# Patient Record
Sex: Female | Born: 2000 | Race: White | Hispanic: No | Marital: Single | Smoking: Never smoker
Health system: Southern US, Community
[De-identification: ages and names within clinical notes are randomized; demographics above are authoritative.]

## PROBLEM LIST (undated history)

## (undated) ENCOUNTER — Inpatient Hospital Stay (HOSPITAL_COMMUNITY): Payer: Self-pay

## (undated) DIAGNOSIS — D649 Anemia, unspecified: Secondary | ICD-10-CM

## (undated) DIAGNOSIS — E079 Disorder of thyroid, unspecified: Secondary | ICD-10-CM

## (undated) DIAGNOSIS — A749 Chlamydial infection, unspecified: Secondary | ICD-10-CM

## (undated) HISTORY — PX: NO PAST SURGERIES: SHX2092

---
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
## 2019-01-05 ENCOUNTER — Other Ambulatory Visit: Payer: Self-pay

## 2019-01-05 ENCOUNTER — Inpatient Hospital Stay (HOSPITAL_COMMUNITY): Payer: Medicaid Other | Admitting: Anesthesiology

## 2019-01-05 ENCOUNTER — Encounter (HOSPITAL_COMMUNITY): Payer: Self-pay | Admitting: *Deleted

## 2019-01-05 ENCOUNTER — Inpatient Hospital Stay (HOSPITAL_COMMUNITY)
Admission: AD | Admit: 2019-01-05 | Discharge: 2019-01-07 | DRG: 807 | Disposition: A | Discharge status: Home / Self Care | Payer: Medicaid Other | Attending: Obstetrics and Gynecology | Admitting: Obstetrics and Gynecology

## 2019-01-05 DIAGNOSIS — Z3A39 39 weeks gestation of pregnancy: Secondary | ICD-10-CM

## 2019-01-05 DIAGNOSIS — O98212 Gonorrhea complicating pregnancy, second trimester: Secondary | ICD-10-CM | POA: Diagnosis present

## 2019-01-05 DIAGNOSIS — N9089 Other specified noninflammatory disorders of vulva and perineum: Secondary | ICD-10-CM

## 2019-01-05 DIAGNOSIS — O9902 Anemia complicating childbirth: Secondary | ICD-10-CM | POA: Diagnosis present

## 2019-01-05 DIAGNOSIS — D509 Iron deficiency anemia, unspecified: Secondary | ICD-10-CM | POA: Diagnosis present

## 2019-01-05 DIAGNOSIS — O99324 Drug use complicating childbirth: Secondary | ICD-10-CM | POA: Diagnosis present

## 2019-01-05 DIAGNOSIS — F1290 Cannabis use, unspecified, uncomplicated: Secondary | ICD-10-CM | POA: Diagnosis present

## 2019-01-05 DIAGNOSIS — O98812 Other maternal infectious and parasitic diseases complicating pregnancy, second trimester: Secondary | ICD-10-CM

## 2019-01-05 DIAGNOSIS — A749 Chlamydial infection, unspecified: Secondary | ICD-10-CM | POA: Diagnosis present

## 2019-01-05 DIAGNOSIS — O26893 Other specified pregnancy related conditions, third trimester: Secondary | ICD-10-CM | POA: Diagnosis present

## 2019-01-05 LAB — CBC
HCT: 34.3 % — ABNORMAL LOW (ref 36.0–46.0)
Hemoglobin: 10.9 g/dL — ABNORMAL LOW (ref 12.0–15.0)
MCH: 24 pg — ABNORMAL LOW (ref 26.0–34.0)
MCHC: 31.8 g/dL (ref 30.0–36.0)
MCV: 75.6 fL — ABNORMAL LOW (ref 80.0–100.0)
Platelets: 196 10*3/uL (ref 150–400)
RBC: 4.54 MIL/uL (ref 3.87–5.11)
RDW: 13.3 % (ref 11.5–15.5)
WBC: 14.6 10*3/uL — ABNORMAL HIGH (ref 4.0–10.5)
nRBC: 0 % (ref 0.0–0.2)

## 2019-01-05 LAB — TYPE AND SCREEN
ABO/RH(D): B NEG
Antibody Screen: NEGATIVE

## 2019-01-05 LAB — ABO/RH: ABO/RH(D): B NEG

## 2019-01-05 MED ORDER — ONDANSETRON HCL 4 MG PO TABS: 4.0000 mg | ORAL_TABLET | ORAL | Status: DC | PRN

## 2019-01-05 MED ORDER — EPHEDRINE 5 MG/ML INJ: 10.0000 mg | INTRAVENOUS | Status: DC | PRN

## 2019-01-05 MED ORDER — ACETAMINOPHEN 325 MG PO TABS: 650.0000 mg | ORAL_TABLET | ORAL | Status: DC | PRN

## 2019-01-05 MED ORDER — DIPHENHYDRAMINE HCL 50 MG/ML IJ SOLN: 12.5000 mg | INTRAMUSCULAR | Status: DC | PRN

## 2019-01-05 MED ORDER — PHENYLEPHRINE 40 MCG/ML (10ML) SYRINGE FOR IV PUSH (FOR BLOOD PRESSURE SUPPORT)
80.0000 ug | PREFILLED_SYRINGE | INTRAVENOUS | Status: DC | PRN
  Filled 2019-01-05: qty 10

## 2019-01-05 MED ORDER — OXYTOCIN BOLUS FROM INFUSION
500.0000 mL | Freq: Once | INTRAVENOUS | Status: AC
  Administered 2019-01-05: 500 mL via INTRAVENOUS

## 2019-01-05 MED ORDER — LACTATED RINGERS IV SOLN
500.0000 mL | Freq: Once | INTRAVENOUS | Status: AC
  Administered 2019-01-05: 500 mL via INTRAVENOUS

## 2019-01-05 MED ORDER — LACTATED RINGERS IV SOLN: 500.0000 mL | INTRAVENOUS | Status: DC | PRN

## 2019-01-05 MED ORDER — COCONUT OIL OIL: 1.0000 "application " | TOPICAL_OIL | Status: DC | PRN

## 2019-01-05 MED ORDER — SOD CITRATE-CITRIC ACID 500-334 MG/5ML PO SOLN: 30.0000 mL | ORAL | Status: DC | PRN

## 2019-01-05 MED ORDER — WITCH HAZEL-GLYCERIN EX PADS: 1.0000 "application " | MEDICATED_PAD | CUTANEOUS | Status: DC | PRN

## 2019-01-05 MED ORDER — FENTANYL CITRATE (PF) 100 MCG/2ML IJ SOLN
50.0000 ug | INTRAMUSCULAR | Status: DC | PRN
  Administered 2019-01-05: 10:00:00 100 ug via INTRAVENOUS
  Filled 2019-01-05: qty 2

## 2019-01-05 MED ORDER — FENTANYL-BUPIVACAINE-NACL 0.5-0.125-0.9 MG/250ML-% EP SOLN
12.0000 mL/h | EPIDURAL | Status: DC | PRN
  Filled 2019-01-05: qty 250

## 2019-01-05 MED ORDER — SIMETHICONE 80 MG PO CHEW: 80.0000 mg | CHEWABLE_TABLET | ORAL | Status: DC | PRN

## 2019-01-05 MED ORDER — OXYCODONE-ACETAMINOPHEN 5-325 MG PO TABS: 1.0000 | ORAL_TABLET | ORAL | Status: DC | PRN

## 2019-01-05 MED ORDER — LIDOCAINE HCL (PF) 1 % IJ SOLN
INTRAMUSCULAR | Status: DC | PRN
  Administered 2019-01-05 (×2): 5 mL via EPIDURAL

## 2019-01-05 MED ORDER — BENZOCAINE-MENTHOL 20-0.5 % EX AERO
1.0000 "application " | INHALATION_SPRAY | CUTANEOUS | Status: DC | PRN
  Administered 2019-01-05: 1 via TOPICAL
  Filled 2019-01-05: qty 56

## 2019-01-05 MED ORDER — OXYTOCIN 40 UNITS IN NORMAL SALINE INFUSION - SIMPLE MED
2.5000 [IU]/h | INTRAVENOUS | Status: DC
  Filled 2019-01-05: qty 1000

## 2019-01-05 MED ORDER — TETANUS-DIPHTH-ACELL PERTUSSIS 5-2.5-18.5 LF-MCG/0.5 IM SUSP: 0.5000 mL | Freq: Once | INTRAMUSCULAR | Status: DC

## 2019-01-05 MED ORDER — PHENYLEPHRINE 40 MCG/ML (10ML) SYRINGE FOR IV PUSH (FOR BLOOD PRESSURE SUPPORT): 80.0000 ug | PREFILLED_SYRINGE | INTRAVENOUS | Status: DC | PRN

## 2019-01-05 MED ORDER — DIBUCAINE 1 % RE OINT: 1.0000 "application " | TOPICAL_OINTMENT | RECTAL | Status: DC | PRN

## 2019-01-05 MED ORDER — FLEET ENEMA 7-19 GM/118ML RE ENEM: 1.0000 | ENEMA | RECTAL | Status: DC | PRN

## 2019-01-05 MED ORDER — LACTATED RINGERS IV SOLN
INTRAVENOUS | Status: DC
  Administered 2019-01-05 (×3): via INTRAVENOUS

## 2019-01-05 MED ORDER — SODIUM CHLORIDE (PF) 0.9 % IJ SOLN
INTRAMUSCULAR | Status: DC | PRN
  Administered 2019-01-05: 12 mL/h via EPIDURAL

## 2019-01-05 MED ORDER — ZOLPIDEM TARTRATE 5 MG PO TABS: 5.0000 mg | ORAL_TABLET | Freq: Every evening | ORAL | Status: DC | PRN

## 2019-01-05 MED ORDER — OXYCODONE-ACETAMINOPHEN 5-325 MG PO TABS: 2.0000 | ORAL_TABLET | ORAL | Status: DC | PRN

## 2019-01-05 MED ORDER — ONDANSETRON HCL 4 MG/2ML IJ SOLN: 4.0000 mg | Freq: Four times a day (QID) | INTRAMUSCULAR | Status: DC | PRN

## 2019-01-05 MED ORDER — IBUPROFEN 600 MG PO TABS
600.0000 mg | ORAL_TABLET | Freq: Four times a day (QID) | ORAL | Status: DC
  Administered 2019-01-05 – 2019-01-07 (×8): 600 mg via ORAL
  Filled 2019-01-05 (×8): qty 1

## 2019-01-05 MED ORDER — PRENATAL MULTIVITAMIN CH
1.0000 | ORAL_TABLET | Freq: Every day | ORAL | Status: DC
  Administered 2019-01-06 – 2019-01-07 (×2): 1 via ORAL
  Filled 2019-01-05 (×2): qty 1

## 2019-01-05 MED ORDER — LIDOCAINE HCL (PF) 1 % IJ SOLN
30.0000 mL | INTRAMUSCULAR | Status: DC | PRN
  Filled 2019-01-05: qty 30

## 2019-01-05 MED ORDER — SENNOSIDES-DOCUSATE SODIUM 8.6-50 MG PO TABS
2.0000 | ORAL_TABLET | ORAL | Status: DC
  Administered 2019-01-05 – 2019-01-06 (×2): 2 via ORAL
  Filled 2019-01-05 (×2): qty 2

## 2019-01-05 MED ORDER — DIPHENHYDRAMINE HCL 25 MG PO CAPS: 25.0000 mg | ORAL_CAPSULE | Freq: Four times a day (QID) | ORAL | Status: DC | PRN

## 2019-01-05 MED ORDER — ONDANSETRON HCL 4 MG/2ML IJ SOLN: 4.0000 mg | INTRAMUSCULAR | Status: DC | PRN

## 2019-01-05 NOTE — Anesthesia Procedure Notes (Signed)
Epidural Patient location during procedure: OB Start time: 01/05/2019 11:05 AM End time: 01/05/2019 11:21 AM  Staffing Anesthesiologist: Heather Roberts, MD Performed: anesthesiologist   Preanesthetic Checklist Completed: patient identified, site marked, pre-op evaluation, timeout performed, IV checked, risks and benefits discussed and monitors and equipment checked  Epidural Patient position: sitting Prep: DuraPrep Patient monitoring: heart rate, cardiac monitor, continuous pulse ox and blood pressure Approach: midline Location: L2-L3 Injection technique: LOR saline  Needle:  Needle type: Tuohy  Needle gauge: 17 G Needle length: 9 cm Needle insertion depth: 6 cm Catheter size: 20 Guage Catheter at skin depth: 11 cm Test dose: negative and Other  Assessment Events: blood not aspirated, injection not painful, no injection resistance and negative IV test  Additional Notes Informed consent obtained prior to proceeding including risk of failure, 1% risk of PDPH, risk of minor discomfort and bruising.  Discussed rare but serious complications including epidural abscess, permanent nerve injury, epidural hematoma.  Discussed alternatives to epidural analgesia and patient desires to proceed.  Timeout performed pre-procedure verifying patient name, procedure, and platelet count.  Patient tolerated procedure well.

## 2019-01-05 NOTE — Anesthesia Preprocedure Evaluation (Signed)
Anesthesia Evaluation  Patient identified by MRN, date of birth, ID band Patient awake    Reviewed: Allergy & Precautions, NPO status , Patient's Chart, lab work & pertinent test results  Airway Mallampati: II  TM Distance: >3 FB     Dental no notable dental hx. (+) Dental Advisory Given   Pulmonary neg pulmonary ROS,    Pulmonary exam normal        Cardiovascular negative cardio ROS Normal cardiovascular exam     Neuro/Psych negative neurological ROS  negative psych ROS   GI/Hepatic negative GI ROS, Neg liver ROS,   Endo/Other  negative endocrine ROS  Renal/GU negative Renal ROS  negative genitourinary   Musculoskeletal negative musculoskeletal ROS (+)   Abdominal   Peds negative pediatric ROS (+)  Hematology negative hematology ROS (+)   Anesthesia Other Findings   Reproductive/Obstetrics (+) Pregnancy                             Anesthesia Physical Anesthesia Plan  ASA: II  Anesthesia Plan: Epidural   Post-op Pain Management:    Induction:   PONV Risk Score and Plan:   Airway Management Planned: Natural Airway  Additional Equipment:   Intra-op Plan:   Post-operative Plan:   Informed Consent: I have reviewed the patients History and Physical, chart, labs and discussed the procedure including the risks, benefits and alternatives for the proposed anesthesia with the patient or authorized representative who has indicated his/her understanding and acceptance.     Dental advisory given  Plan Discussed with: Anesthesiologist  Anesthesia Plan Comments:         Anesthesia Quick Evaluation

## 2019-01-05 NOTE — H&P (Signed)
LABOR AND DELIVERY ADMISSION HISTORY AND PHYSICAL NOTE  Erin Powers is a 18 y.o. female G1P1001 with IUP at [redacted]w[redacted]d by LMP presenting for SOL @ 0500.  She reports positive fetal movement. She denies leakage of fluid or vaginal bleeding.  Prenatal History/Complications: PNC at [facility] Pregnancy complications:  - none  Past Medical History: Past Medical History:  Diagnosis Date  . Anemia   . Chlamydia     Past Surgical History: Past Surgical History:  Procedure Laterality Date  . NO PAST SURGERIES      Obstetrical History: OB History    Gravida  1   Para  1   Term  1   Preterm      AB      Living  1     SAB      TAB      Ectopic      Multiple  0   Live Births  1           Social History: Social History   Socioeconomic History  . Marital status: Single    Spouse name: Not on file  . Number of children: Not on file  . Years of education: Not on file  . Highest education level: Not on file  Occupational History  . Not on file  Social Needs  . Financial resource strain: Not on file  . Food insecurity:    Worry: Not on file    Inability: Not on file  . Transportation needs:    Medical: Not on file    Non-medical: Not on file  Tobacco Use  . Smoking status: Never Smoker  . Smokeless tobacco: Never Used  Substance and Sexual Activity  . Alcohol use: No  . Drug use: Yes    Types: Marijuana    Comment: last smoked Feb/March 2020  . Sexual activity: Yes    Birth control/protection: None  Lifestyle  . Physical activity:    Days per week: Not on file    Minutes per session: Not on file  . Stress: Not on file  Relationships  . Social connections:    Talks on phone: Not on file    Gets together: Not on file    Attends religious service: Not on file    Active member of club or organization: Not on file    Attends meetings of clubs or organizations: Not on file    Relationship status: Not on file  Other Topics Concern  . Not on file   Social History Narrative  . Not on file    Family History: Family History  Problem Relation Age of Onset  . Diabetes Mother     Allergies: Allergies  Allergen Reactions  . Latex Hives    Medications Prior to Admission  Medication Sig Dispense Refill Last Dose  . clotrimazole (GYNE-LOTRIMIN) 1 % vaginal cream INSERT ONE APPLICATORFUL VAGINALLY EACH NIGHT AT BEDTIME.   Past Week at Unknown time  . Prenatal Vit-Fe Fumarate-FA (PRENATAL MULTIVITAMIN) TABS tablet Take 1 tablet by mouth daily at 12 noon.   Past Week at Unknown time  . valACYclovir (VALTREX) 1000 MG tablet Take 1 tablet (1,000 mg total) by mouth 2 (two) times daily. 20 tablet 0      Review of Systems  All systems reviewed and negative except as stated in HPI  Physical Exam Blood pressure 117/69, pulse 79, temperature 98.1 F (36.7 C), temperature source Oral, resp. rate 18, height 5\' 1"  (1.549 m), weight 69 kg,  last menstrual period 03/03/2018, SpO2 98 %, currently breastfeeding. General appearance: alert, cooperative and no distress Lungs: clear to auscultation bilaterally Heart: regular rate and rhythm Abdomen: soft, non-tender; bowel sounds normal Extremities: No calf swelling or tenderness Presentation: cephalic CEFM - FHR: 125 bpm / moderate variability / accels present / decels absent / TOCO: regular every 2-4 mins Dilation: 10 Effacement (%): 100 Station: Plus 3 Exam by:: J.Cox, RN  Prenatal labs: ABO, Rh: --/--/B NEG, B NEG Performed at [facility], [address], [place], [place] [address]  [phone]) Antibody: NEG (04/06 0165) Rubella: Immune (08/22 0000) RPR: Nonreactive (08/22 0000)  HBsAg: Negative (08/22 0000)  HIV: Non-reactive (08/22 0000)  GC/Chlamydia: Neg/Neg -- third trimester (Pos/Pos in early pregnancy) GBS: Negative (03/16 0000)  1 hr Glucola: Normal  104 Genetic screening:  Normal Anatomy US: Normal  Prenatal Transfer Tool  Maternal Diabetes: No Genetic  Screening: Normal Maternal Ultrasounds/Referrals: Normal Fetal Ultrasounds or other Referrals:  None Maternal Substance Abuse:  No Significant Maternal Medications:  None Significant Maternal Lab Results: Lab values include: Group B Strep negative  Results for orders placed or performed during the hospital encounter of 01/05/19 (from the past 24 hour(s))  Type and screen [facility]   Collection Time: 01/05/19  9:03 AM  Result Value Ref Range   ABO/RH(D) B NEG    Antibody Screen NEG    Sample Expiration      01/08/2019 Performed at [facility], [address] [place], [place] [address]   ABO/Rh   Collection Time: 01/05/19  9:03 AM  Result Value Ref Range   ABO/RH(D)      B NEG Performed at [facility], [address], [place], [place] [address]   CBC   Collection Time: 01/05/19  9:13 AM  Result Value Ref Range   WBC 14.6 (H) 4.0 - 10.5 K/uL   RBC 4.54 3.87 - 5.11 MIL/uL   Hemoglobin 10.9 (L) 12.0 - 15.0 g/dL   HCT 67.5 (L) 44.9 - 20.1 %   MCV 75.6 (L) 80.0 - 100.0 fL   MCH 24.0 (L) 26.0 - 34.0 pg   MCHC 31.8 30.0 - 36.0 g/dL   RDW 00.7 12.1 - 97.5 %   Platelets 196 150 - 400 K/uL   nRBC 0.0 0.0 - 0.2 %    Patient Active Problem List   Diagnosis Date Noted  . Normal labor 01/05/2019  . Gonorrhea affecting pregnancy in second trimester 10/08/2018  . Chlamydia infection affecting pregnancy in second trimester 10/08/2018  . Labial lesion 10/08/2018  . Anemia, iron deficiency 06/19/2013  . Migraine 10/27/2012    Assessment: Erin Powers is a 18 y.o. G1P1001 at [redacted]w[redacted]d here for SOL.  #Labor: Active #Pain: Epidural #FWB: Category 1 #ID:  n/a #MOF: Breast #MOC:unsure #Circ:  Kimber Relic, CNM 01/05/2019, 8:17 PM

## 2019-01-05 NOTE — Discharge Summary (Signed)
Postpartum Discharge Summary     Patient Name: Erin Powers DOB: 17-Sep-2001 MRN: [id number]  Date of admission: 01/05/2019 Delivering Provider: [name]   Date of discharge: 01/07/2019  Admitting diagnosis: 40WKS CTX Intrauterine pregnancy: [redacted]w[redacted]d     Secondary diagnosis:  Principal Problem:   Normal labor Active Problems:   Anemia, iron deficiency   Gonorrhea affecting pregnancy in second trimester   Chlamydia infection affecting pregnancy in second trimester  Additional problems: none     Discharge diagnosis: Term Pregnancy Delivered                                                                                                Post partum procedures:none  Augmentation: none  Complications: None  Hospital course:  Onset of Labor With Vaginal Delivery     18 y.o. yo G1P1001 at [redacted]w[redacted]d was admitted in Active Labor on 01/05/2019. Patient had an uncomplicated labor course as follows:  Membrane Rupture Time/Date: 10:40 AM ,01/05/2019   Intrapartum Procedures: Episiotomy: None [1]                                         Lacerations:  1st degree [2];Perineal [11]  Patient had a delivery of a Viable infant. 01/05/2019  Information for the patient's newborn:  Onisha, [place] [[id number]]  Delivery Method: Vag-Spont    Pateint had an uncomplicated postpartum course.  She is ambulating, tolerating a regular diet, passing flatus, and urinating well. Patient is discharged home in stable condition on 01/07/19.   Magnesium Sulfate recieved: No BMZ received: No  Physical exam  Vitals:   01/06/19 0522 01/06/19 1324 01/06/19 2148 01/07/19 0500  BP: 109/60 105/69 108/75 109/79  Pulse: 75 64 83 69  Resp: 16 16 16 18   Temp: 97.8 F (36.6 C) 98.1 F (36.7 C) 98.2 F (36.8 C) 98.2 F (36.8 C)  TempSrc: Oral Oral Oral Oral  SpO2:  100% 99% 97%  Weight:      Height:       General: alert, cooperative and no distress Lochia: appropriate Uterine Fundus: firm Incision:  N/A DVT Evaluation: No evidence of DVT seen on physical exam. Labs: Lab Results  Component Value Date   WBC 14.6 (H) 01/05/2019   HGB 10.9 (L) 01/05/2019   HCT 34.3 (L) 01/05/2019   MCV 75.6 (L) 01/05/2019   PLT 196 01/05/2019   CMP Latest Ref Rng & Units 04/24/2013  Glucose 70 - 99 mg/dL 82  BUN 6 - 23 mg/dL 8  Creatinine 8.29 - 9.37 mg/dL 1.69  Sodium 678 - 938 mEq/L 141  Potassium 3.5 - 5.3 mEq/L 4.4  Chloride 96 - 112 mEq/L 108  CO2 19 - 32 mEq/L 23  Calcium 8.4 - 10.5 mg/dL 9.5  Total Protein 6.0 - 8.3 g/dL 6.6  Total Bilirubin 0.3 - 1.2 mg/dL 0.3  Alkaline Phos 50 - 162 U/L 121  AST 0 - 37 U/L 15  ALT 0 - 35 U/L 9    Discharge instruction:  per After Visit Summary and "Baby and Me Booklet".  After visit meds:  Allergies as of 01/07/2019      Reactions   Latex Hives      Medication List    STOP taking these medications   clotrimazole 1 % vaginal cream Commonly known as:  GYNE-LOTRIMIN   valACYclovir 1000 MG tablet Commonly known as:  VALTREX     TAKE these medications   ibuprofen 600 MG tablet Commonly known as:  ADVIL,MOTRIN Take 1 tablet (600 mg total) by mouth every 6 (six) hours.   prenatal multivitamin Tabs tablet Take 1 tablet by mouth daily at 12 noon.       Diet: No evidence of DVT seen on physical exam.  Activity: Advance as tolerated. Pelvic rest for 6 weeks.   Outpatient follow up:4 weeks Follow up Appt: Future Appointments  Date Time Provider Department Center  01/12/2019 11:30 AM [facility] [place] 1 WH-[facility]   Follow up Visit: Follow-up Information    Department, [facility] Follow up.   Contact information: [address] [place] [address] [phone]            Please schedule this patient for Postpartum visit in: 6 weeks with the following provider: Any provider For C/S patients schedule nurse incision check in weeks 2 weeks: no Low risk pregnancy complicated by: none Delivery mode:  SVD Anticipated  Birth Control:  other/unsure PP Procedures needed: none  Schedule Integrated BH visit: no   Newborn Data: Live born female "Serenity" Birth Weight: 7 lb 9.2 oz (3435 g) APGAR: 8, 9  Newborn Delivery   Birth date/time:  01/05/2019 13:51:00 Delivery type:  Vaginal, Spontaneous     Baby Feeding: Breast Disposition:home with mother   01/07/2019 Scheryl Darter, MD

## 2019-01-05 NOTE — MAU Note (Signed)
Reports with ctxs every 4 minutes that began @ 0530 this morning.  Denies LOF or VB.  Repots +FM.

## 2019-01-05 NOTE — Anesthesia Postprocedure Evaluation (Signed)
Anesthesia Post Note  Patient: Erin Powers  Procedure(s) Performed: AN AD HOC LABOR EPIDURAL     Patient location during evaluation: Mother Baby Anesthesia Type: Epidural Level of consciousness: awake and oriented Pain management: pain level controlled Vital Signs Assessment: post-procedure vital signs reviewed and stable Respiratory status: spontaneous breathing and respiratory function stable Cardiovascular status: blood pressure returned to baseline and stable Postop Assessment: no headache Anesthetic complications: no    Last Vitals:  Vitals:   01/05/19 1546 01/05/19 1649  BP: 114/70 117/69  Pulse: 75 79  Resp: 18 18  Temp: 36.7 C 36.7 C  SpO2: 99% 98%    Last Pain:  Vitals:   01/05/19 1649  TempSrc: Oral  PainSc:    Pain Goal:                   Erin Powers

## 2019-01-06 LAB — RPR: RPR Ser Ql: NONREACTIVE

## 2019-01-06 NOTE — Clinical Social Work Maternal (Signed)
CLINICAL SOCIAL WORK MATERNAL/CHILD NOTE  Patient Details  Name: Erin Powers MRN: [id number] Date of Birth: 07-04-2001  Date:  01/06/2019  Clinical Social Worker Initiating Note:  Hortencia Pilar, LCSWA  Date/Time: Initiated:  01/06/19/0930     Child's Name:  Erin Powers    Biological Parents:  Mother(Erin Powers)   Need for Interpreter:  None   Reason for Referral:  Current Substance Use/Substance Use During Pregnancy    Address:  [address] [place] [address]    Phone number:  [phone] (home)     Additional phone number:  Household Members/Support Persons (HM/SP):   Household Member/Support Person 3   HM/SP Name Relationship DOB or Age  HM/SP -1   Erin Powers   [name] dad    HM/SP -2   Erin Powers   [name] mom    HM/SP -3 Erin Powers  [name]   HM/SP -4        HM/SP -5        HM/SP -6        HM/SP -7        HM/SP -8          Natural Supports (not living in the home):      Professional Supports: None   Employment: Unemployed   Type of Work:     Education:  Engineer, agricultural   Homebound arranged:    Surveyor, quantity Resources:  [employer]   Other Resources:  [employer], Sales executive ([name] reports that she needs to reapply. )   Cultural/Religious Considerations Which May Impact Care:  none reported   Strengths:  Compliance with medical plan , Ability to meet basic needs    Psychotropic Medications:         Pediatrician:       Pediatrician List:   [facility] [place]    [place]      Pediatrician Fax Number:    Risk Factors/Current Problems:  Family/Relationship Issues ([name] not sure who FOB is at this time. )   Cognitive State:  Alert , Able to Concentrate , Insightful    Mood/Affect:  Calm , Interested    CSW Assessment: CSW consulted as [name] has documented THC use during pregnancy. CSW went to speak with [name] at bedside.   Upon entering the  room, CSW noted that [name] was lying in bed holding infant. CSW also observed that a gentleman was lying on the couch asleep. CSW advised [name] that by hospital policy we have to ask that all parties in the room step out to speak with [name] alone. [name] requested that gentleman stay in the room.    CSW began assessment by introducing CSW's role as well as informing [name] the reason for CSW coming to visit. [name] was very receptive to speaking with CSW and began to talk. [name] expressed that she did use THC during her pregnancy (last use being in February) as a way to sleep as well as to for nauseous. [name] reports that she didn't do this often only when needed. CSW advised [name] that since [name] has used THC during pregnancy, a CPS report would need to be made if infant came back positive for any substance CSW advised [name] of the process for Cord Screen and the need for CPS report to be made if infant comes back positive for any substance that wasn't prescribed to her. [name] understanding and  denies using any other substances.    [name] reports that she is currently unsure of who the infants father is therefore chose to not give a name for FOB. [name] identified her supports as the people who she lives with which are her mother and father Erin Powers(Erin Powers and Erin DikeJennifer). [name] report that they are who she can count on to provide for her if no one else will or can. [name] disclosed that she graduated from [employer] in 2018. [name] currently unemployed per report. [name] reports that she was getting Food Stamps and WIC but needs to get this restarted for her and infant. CSW strongly encouraged [name] to call worker with DSS to see about getting this as well as Medicaid started for her and infant. [name] reports that she will.    CSW discussed with [name] safe sleeping habits as well as signs and symptoms for PPD. [name] denies ever having any mental health diagnoses but suggested that if they started she would seek help. CSW provide further  education to [facility] on the importance of self care for [name] and infant.   CSW Plan/Description:  Hospital Drug Screen Policy Information, CSW Will Continue to Monitor Umbilical Cord Tissue Drug Screen Results and Make Report if Warranted, Sudden Infant Death Syndrome (SIDS) Education, No Further Intervention Required/No Barriers to Discharge    Robb MatarKierra S Cayle Thunder, LCSWA 01/06/2019, 10:31 AM

## 2019-01-06 NOTE — Lactation Note (Signed)
This note was copied from a baby's chart. Lactation Consultation Note  Patient Name: Erin Powers [id number] Date: 01/06/2019 Reason for consult: Initial assessment;Primapara;Term Baby is 21 hours old/2% weight loss.  Mom states baby fed well during the night but sleepy this morning.  She recently attempted but baby showing no interest.  Instructed to feed with any feeding cue and call for assist prn.  Breastfeeding consultation services and support information given and reviewed.  Maternal Data    Feeding    LATCH Score                   Interventions    Lactation Tools Discussed/Used     Consult Status Consult Status: Follow-up Date: 01/07/19 Follow-up type: In-patient    Huston Foley 01/06/2019, 11:16 AM

## 2019-01-06 NOTE — Progress Notes (Signed)
Post Partum Day 1  Subjective: no complaints, up ad lib, voiding, tolerating PO and + flatus  Objective: Blood pressure 109/60, pulse 75, temperature 97.8 F (36.6 C), temperature source Oral, resp. rate 16, height 5\' 1"  (1.549 m), weight 69 kg, last menstrual period 03/03/2018, SpO2 98 %, unknown if currently breastfeeding.  Physical Exam:  General: alert, cooperative and no distress Lochia: appropriate Uterine Fundus: firm Incision: n/a DVT Evaluation: No evidence of DVT seen on physical exam.  Recent Labs    01/05/19 0913  HGB 10.9*  HCT 34.3*    Assessment/Plan: Contraception Considering inpatient Nexplanon, Would like D/C today. Pediatrician to round on baby.   LOS: 1 day   Sharen Counter 01/06/2019, 10:28 AM

## 2019-01-06 NOTE — Lactation Note (Signed)
This note was copied from a baby's chart. Lactation Consultation Note  Patient Name: Erin Powers [id number] Date: 01/06/2019  P1, 15 hour female infant. LC entered room mom, dad and infant asleep.   Maternal Data    Feeding Feeding Type: Breast Fed  LATCH Score                   Interventions    Lactation Tools Discussed/Used     Consult Status      Danelle Earthly 01/06/2019, 5:03 AM

## 2019-01-07 MED ORDER — IBUPROFEN 600 MG PO TABS: 600.0000 mg | ORAL_TABLET | Freq: Four times a day (QID) | ORAL | 0 refills | Status: DC

## 2019-01-07 NOTE — Discharge Instructions (Signed)
Vaginal Delivery, Care After °Refer to this sheet in the next few weeks. These instructions provide you with information about caring for yourself after vaginal delivery. Your health care provider may also give you more specific instructions. Your treatment has been planned according to current medical practices, but problems sometimes occur. Call your health care provider if you have any problems or questions. °What can I expect after the procedure? °After vaginal delivery, it is common to have: °· Some bleeding from your vagina. °· Soreness in your abdomen, your vagina, and the area of skin between your vaginal opening and your anus (perineum). °· Pelvic cramps. °· Fatigue. °Follow these instructions at home: °Medicines °· Take over-the-counter and prescription medicines only as told by your health care provider. °· If you were prescribed an antibiotic medicine, take it as told by your health care provider. Do not stop taking the antibiotic until it is finished. °Driving ° °· Do not drive or operate heavy machinery while taking prescription pain medicine. °· Do not drive for 24 hours if you received a sedative. °Lifestyle °· Do not drink alcohol. This is especially important if you are breastfeeding or taking medicine to relieve pain. °· Do not use tobacco products, including cigarettes, chewing tobacco, or e-cigarettes. If you need help quitting, ask your health care provider. °Eating and drinking °· Drink at least 8 eight-ounce glasses of water every day unless you are told not to by your health care provider. If you choose to breastfeed your baby, you may need to drink more water than this. °· Eat high-fiber foods every day. These foods may help prevent or relieve constipation. High-fiber foods include: °? Whole grain cereals and breads. °? Brown rice. °? Beans. °? Fresh fruits and vegetables. °Activity °· Return to your normal activities as told by your health care provider. Ask your health care provider what  activities are safe for you. °· Rest as much as possible. Try to rest or take a nap when your baby is sleeping. °· Do not lift anything that is heavier than your baby or 10 lb (4.5 kg) until your health care provider says that it is safe. °· Talk with your health care provider about when you can engage in sexual activity. This may depend on your: °? Risk of infection. °? Rate of healing. °? Comfort and desire to engage in sexual activity. °Vaginal Care °· If you have an episiotomy or a vaginal tear, check the area every day for signs of infection. Check for: °? More redness, swelling, or pain. °? More fluid or blood. °? Warmth. °? Pus or a bad smell. °· Do not use tampons or douches until your health care provider says this is safe. °· Watch for any blood clots that may pass from your vagina. These may look like clumps of dark red, brown, or black discharge. °General instructions °· Keep your perineum clean and dry as told by your health care provider. °· Wear loose, comfortable clothing. °· Wipe from front to back when you use the toilet. °· Ask your health care provider if you can shower or take a bath. If you had an episiotomy or a perineal tear during labor and delivery, your health care provider may tell you not to take baths for a certain length of time. °· Wear a bra that supports your breasts and fits you well. °· If possible, have someone help you with household activities and help care for your baby for at least a few days after you   leave the hospital. °· Keep all follow-up visits for you and your baby as told by your health care provider. This is important. °Contact a health care provider if: °· You have: °? Vaginal discharge that has a bad smell. °? Difficulty urinating. °? Pain when urinating. °? A sudden increase or decrease in the frequency of your bowel movements. °? More redness, swelling, or pain around your episiotomy or vaginal tear. °? More fluid or blood coming from your episiotomy or vaginal  tear. °? Pus or a bad smell coming from your episiotomy or vaginal tear. °? A fever. °? A rash. °? Little or no interest in activities you used to enjoy. °? Questions about caring for yourself or your baby. °· Your episiotomy or vaginal tear feels warm to the touch. °· Your episiotomy or vaginal tear is separating or does not appear to be healing. °· Your breasts are painful, hard, or turn red. °· You feel unusually sad or worried. °· You feel nauseous or you vomit. °· You pass large blood clots from your vagina. If you pass a blood clot from your vagina, save it to show to your health care provider. Do not flush blood clots down the toilet without having your health care provider look at them. °· You urinate more than usual. °· You are dizzy or light-headed. °· You have not breastfed at all and you have not had a menstrual period for 12 weeks after delivery. °· You have stopped breastfeeding and you have not had a menstrual period for 12 weeks after you stopped breastfeeding. °Get help right away if: °· You have: °? Pain that does not go away or does not get better with medicine. °? Chest pain. °? Difficulty breathing. °? Blurred vision or spots in your vision. °? Thoughts about hurting yourself or your baby. °· You develop pain in your abdomen or in one of your legs. °· You develop a severe headache. °· You faint. °· You bleed from your vagina so much that you fill two sanitary pads in one hour. °This information is not intended to replace advice given to you by your health care provider. Make sure you discuss any questions you have with your health care provider. °Document Released: 09/14/2000 Document Revised: 02/29/2016 Document Reviewed: 10/02/2015 °Elsevier Interactive Patient Education © 2019 [employer] ° °

## 2019-01-07 NOTE — Lactation Note (Signed)
This note was copied from a baby's chart. Lactation Consultation Note  Patient Name: Erin Powers CYELY'H Date: 01/07/2019 Reason for consult: Follow-up assessment;Term;Primapara;1st time breastfeeding  P1 mother whose infant is now 16 hours old.    Baby sleeping in bassinet when I arrived.  Mother had no questions/concerns related to breast feeding.  Her feeding plan of choice is breast/bottle.    Mother continues to breast feed and supplement after feeding.  Reminded her to always breast feed prior to supplementation.  She stated her breasts feel heavier this morning.  Engorgement prevention/treatment discussed.  Manual pumps from the hospital are on back order so I was not able to provide a pump.  Mother is familiar with hand expression.  She plans to obtain a DEBP after discharge.  Mother stated her nipples are sore.  She feels like latching is going well and there is no pain with breast feeding.  Encouraged her to use her EBM and coconut oil after feeding.  Comfort gels provided with instructions for use.  Mother has our OP phone number for questions/concerns after discharge.  Father present.   Maternal Data Formula Feeding for Exclusion: Yes Reason for exclusion: Mother's choice to formula and breast feed on admission Has patient been taught Hand Expression?: Yes Does the patient have breastfeeding experience prior to this delivery?: No  Feeding    LATCH Score                   Interventions    Lactation Tools Discussed/Used     Consult Status Consult Status: Complete Date: 01/07/19 Follow-up type: Call as needed    Jeslie Lowe R Klee Kolek 01/07/2019, 9:05 AM

## 2019-01-12 ENCOUNTER — Ambulatory Visit (HOSPITAL_COMMUNITY): Payer: Medicaid Other

## 2021-08-24 ENCOUNTER — Emergency Department (HOSPITAL_BASED_OUTPATIENT_CLINIC_OR_DEPARTMENT_OTHER)
Admission: EM | Admit: 2021-08-24 | Discharge: 2021-08-24 | Disposition: A | Discharge status: Home / Self Care | Payer: Medicaid Other | Attending: Emergency Medicine | Admitting: Emergency Medicine

## 2021-08-24 ENCOUNTER — Emergency Department (HOSPITAL_BASED_OUTPATIENT_CLINIC_OR_DEPARTMENT_OTHER): Payer: Medicaid Other | Admitting: Radiology

## 2021-08-24 ENCOUNTER — Encounter (HOSPITAL_BASED_OUTPATIENT_CLINIC_OR_DEPARTMENT_OTHER): Payer: Self-pay | Admitting: Obstetrics and Gynecology

## 2021-08-24 ENCOUNTER — Other Ambulatory Visit: Payer: Self-pay

## 2021-08-24 ENCOUNTER — Emergency Department (HOSPITAL_BASED_OUTPATIENT_CLINIC_OR_DEPARTMENT_OTHER): Payer: Medicaid Other

## 2021-08-24 DIAGNOSIS — R1011 Right upper quadrant pain: Secondary | ICD-10-CM | POA: Diagnosis not present

## 2021-08-24 DIAGNOSIS — R0781 Pleurodynia: Secondary | ICD-10-CM

## 2021-08-24 DIAGNOSIS — Z7722 Contact with and (suspected) exposure to environmental tobacco smoke (acute) (chronic): Secondary | ICD-10-CM | POA: Insufficient documentation

## 2021-08-24 DIAGNOSIS — Z91040 Latex allergy status: Secondary | ICD-10-CM | POA: Diagnosis not present

## 2021-08-24 HISTORY — DX: Disorder of thyroid, unspecified: E07.9

## 2021-08-24 LAB — CBC
HCT: 42.6 % (ref 36.0–46.0)
Hemoglobin: 14.5 g/dL (ref 12.0–15.0)
MCH: 29.2 pg (ref 26.0–34.0)
MCHC: 34 g/dL (ref 30.0–36.0)
MCV: 85.9 fL (ref 80.0–100.0)
Platelets: 296 10*3/uL (ref 150–400)
RBC: 4.96 MIL/uL (ref 3.87–5.11)
RDW: 13.3 % (ref 11.5–15.5)
WBC: 9.6 10*3/uL (ref 4.0–10.5)
nRBC: 0 % (ref 0.0–0.2)

## 2021-08-24 LAB — COMPREHENSIVE METABOLIC PANEL
ALT: 10 U/L (ref 0–44)
AST: 16 U/L (ref 15–41)
Albumin: 4.8 g/dL (ref 3.5–5.0)
Alkaline Phosphatase: 68 U/L (ref 38–126)
Anion gap: 8 (ref 5–15)
BUN: 13 mg/dL (ref 6–20)
CO2: 27 mmol/L (ref 22–32)
Calcium: 9.8 mg/dL (ref 8.9–10.3)
Chloride: 104 mmol/L (ref 98–111)
Creatinine, Ser: 0.62 mg/dL (ref 0.44–1.00)
GFR, Estimated: >60 mL/min (ref >60)
Glucose, Bld: 92 mg/dL (ref 70–99)
Potassium: 3.9 mmol/L (ref 3.5–5.1)
Sodium: 139 mmol/L (ref 135–145)
Total Bilirubin: 0.6 mg/dL (ref 0.3–1.2)
Total Protein: 7.5 g/dL (ref 6.5–8.1)

## 2021-08-24 LAB — PREGNANCY, URINE: Preg Test, Ur: NEGATIVE

## 2021-08-24 MED ORDER — KETOROLAC TROMETHAMINE 15 MG/ML IJ SOLN
15.0000 mg | Freq: Once | INTRAMUSCULAR | Status: AC
  Administered 2021-08-24: 15 mg via INTRAVENOUS
  Filled 2021-08-24: qty 1

## 2021-08-24 MED ORDER — MELOXICAM 7.5 MG PO TABS: 7.5000 mg | ORAL_TABLET | Freq: Every day | ORAL | 0 refills | Status: AC

## 2021-08-24 MED ORDER — METHOCARBAMOL 500 MG PO TABS: 500.0000 mg | ORAL_TABLET | Freq: Four times a day (QID) | ORAL | 0 refills | Status: AC

## 2021-08-24 MED ORDER — IOHEXOL 300 MG/ML  SOLN
75.0000 mL | Freq: Once | INTRAMUSCULAR | Status: AC | PRN
  Administered 2021-08-24: 75 mL via INTRAVENOUS
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
## 2021-08-24 NOTE — Discharge Instructions (Signed)
Please read and follow all provided instructions.  Your diagnoses today include:  1. Rib pain on right side   2. Right upper quadrant abdominal pain     Tests performed today include: Complete blood cell count: Was normal Complete metabolic panel: Was normal Pregnancy test (urine or blood, in women only): negative CT imaging and chest/rib x-ray: Were negative for broken bones or severe injuries Vital signs. See below for your results today.   Medications prescribed:  Robaxin (methocarbamol) - muscle relaxer medication  DO NOT drive or perform any activities that require you to be awake and alert because this medicine can make you drowsy.   Meloxicam - anti-inflammatory pain medication  You have been prescribed an anti-inflammatory medication or NSAID. Take with food. Do not take aspirin, ibuprofen, or naproxen if taking this medication. Take smallest effective dose for the shortest duration needed for your pain. Stop taking if you experience stomach pain or vomiting.   Take any prescribed medications only as directed.  Home care instructions:  Follow any educational materials contained in this packet.  Follow-up instructions: Please follow-up with your primary care provider in the next 3 days for further evaluation of your symptoms.    Return instructions:  SEEK IMMEDIATE MEDICAL ATTENTION IF: The pain does not go away or becomes severe  A temperature above 101F develops  Repeated vomiting occurs (multiple episodes)  The pain becomes localized to portions of the abdomen. The right side could possibly be appendicitis. In an adult, the left lower portion of the abdomen could be colitis or diverticulitis.  Blood is being passed in stools or vomit (bright red or black tarry stools)  You develop chest pain, difficulty breathing, dizziness or fainting, or become confused, poorly responsive, or inconsolable (young children) If you have any other emergent concerns regarding your  health  Additional Information: Abdominal (belly) pain can be caused by many things. Your caregiver performed an examination and possibly ordered blood/urine tests and imaging (CT scan, x-rays, ultrasound). Many cases can be observed and treated at home after initial evaluation in the emergency department. Even though you are being discharged home, abdominal pain can be unpredictable. Therefore, you need a repeated exam if your pain does not resolve, returns, or worsens. Most patients with abdominal pain don't have to be admitted to the hospital or have surgery, but serious problems like appendicitis and gallbladder attacks can start out as nonspecific pain. Many abdominal conditions cannot be diagnosed in one visit, so follow-up evaluations are very important.  Your vital signs today were: BP 99/83 (BP Location: Left Arm)   Pulse 86   Temp 98 F (36.7 C)   Resp 14   Ht 5\' 1"  (1.549 m)   Wt 56.7 kg   SpO2 100%   Breastfeeding No Comment: Nexplanon  BMI 23.62 kg/m  If your blood pressure (bp) was elevated above 135/85 this visit, please have this repeated by your doctor within one month. --------------

## 2022-05-27 ENCOUNTER — Encounter (HOSPITAL_BASED_OUTPATIENT_CLINIC_OR_DEPARTMENT_OTHER): Payer: Self-pay

## 2022-05-27 ENCOUNTER — Emergency Department (HOSPITAL_BASED_OUTPATIENT_CLINIC_OR_DEPARTMENT_OTHER)
Admission: EM | Admit: 2022-05-27 | Discharge: 2022-05-27 | Disposition: A | Discharge status: Home / Self Care | Payer: Medicaid Other | Attending: Emergency Medicine | Admitting: Emergency Medicine

## 2022-05-27 ENCOUNTER — Emergency Department (HOSPITAL_BASED_OUTPATIENT_CLINIC_OR_DEPARTMENT_OTHER): Payer: Medicaid Other

## 2022-05-27 DIAGNOSIS — W01198A Fall on same level from slipping, tripping and stumbling with subsequent striking against other object, initial encounter: Secondary | ICD-10-CM | POA: Diagnosis not present

## 2022-05-27 DIAGNOSIS — S1091XA Abrasion of unspecified part of neck, initial encounter: Secondary | ICD-10-CM

## 2022-05-27 DIAGNOSIS — S0511XA Contusion of eyeball and orbital tissues, right eye, initial encounter: Secondary | ICD-10-CM

## 2022-05-27 DIAGNOSIS — S01411A Laceration without foreign body of right cheek and temporomandibular area, initial encounter: Secondary | ICD-10-CM | POA: Diagnosis not present

## 2022-05-27 DIAGNOSIS — S0181XA Laceration without foreign body of other part of head, initial encounter: Secondary | ICD-10-CM

## 2022-05-27 DIAGNOSIS — S0990XA Unspecified injury of head, initial encounter: Secondary | ICD-10-CM | POA: Diagnosis present

## 2022-05-27 MED ORDER — IBUPROFEN 200 MG PO TABS
ORAL_TABLET | ORAL | Status: AC
  Filled 2022-05-27: qty 2

## 2022-05-27 MED ORDER — ACETAMINOPHEN 325 MG PO TABS
650.0000 mg | ORAL_TABLET | Freq: Once | ORAL | Status: AC
  Administered 2022-05-27: 650 mg via ORAL
  Filled 2022-05-27: qty 2

## 2022-05-27 MED ORDER — IBUPROFEN 400 MG PO TABS
400.0000 mg | ORAL_TABLET | Freq: Once | ORAL | Status: AC
  Administered 2022-05-27: 400 mg via ORAL

## 2022-05-27 MED ORDER — LIDOCAINE HCL (PF) 1 % IJ SOLN
5.0000 mL | Freq: Once | INTRAMUSCULAR | Status: AC
  Administered 2022-05-27: 5 mL
  Filled 2022-05-27: qty 5
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
## 2022-05-27 NOTE — ED Notes (Signed)
Discharge instructions and follow up care for suture removal reviewed and explained. Explained to pt s/s to return to ED, pt verbalized understanding and had no further questions on d/c.

## 2022-05-27 NOTE — ED Notes (Signed)
[facility] planning/management officer at the bedside.
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
## 2022-11-12 ENCOUNTER — Emergency Department (HOSPITAL_BASED_OUTPATIENT_CLINIC_OR_DEPARTMENT_OTHER)
Admission: EM | Admit: 2022-11-12 | Discharge: 2022-11-12 | Disposition: A | Discharge status: Home / Self Care | Payer: Medicaid Other | Attending: Emergency Medicine | Admitting: Emergency Medicine

## 2022-11-12 ENCOUNTER — Encounter (HOSPITAL_BASED_OUTPATIENT_CLINIC_OR_DEPARTMENT_OTHER): Payer: Self-pay

## 2022-11-12 ENCOUNTER — Other Ambulatory Visit: Payer: Self-pay

## 2022-11-12 ENCOUNTER — Emergency Department (HOSPITAL_BASED_OUTPATIENT_CLINIC_OR_DEPARTMENT_OTHER): Payer: Medicaid Other

## 2022-11-12 DIAGNOSIS — N3000 Acute cystitis without hematuria: Secondary | ICD-10-CM | POA: Insufficient documentation

## 2022-11-12 DIAGNOSIS — O209 Hemorrhage in early pregnancy, unspecified: Secondary | ICD-10-CM | POA: Insufficient documentation

## 2022-11-12 DIAGNOSIS — Z3201 Encounter for pregnancy test, result positive: Secondary | ICD-10-CM

## 2022-11-12 DIAGNOSIS — Z8759 Personal history of other complications of pregnancy, childbirth and the puerperium: Secondary | ICD-10-CM

## 2022-11-12 DIAGNOSIS — Z3A01 Less than 8 weeks gestation of pregnancy: Secondary | ICD-10-CM | POA: Diagnosis not present

## 2022-11-12 DIAGNOSIS — Z8579 Personal history of other malignant neoplasms of lymphoid, hematopoietic and related tissues: Secondary | ICD-10-CM | POA: Diagnosis not present

## 2022-11-12 DIAGNOSIS — O2301 Infections of kidney in pregnancy, first trimester: Secondary | ICD-10-CM | POA: Diagnosis not present

## 2022-11-12 DIAGNOSIS — O4690 Antepartum hemorrhage, unspecified, unspecified trimester: Secondary | ICD-10-CM

## 2022-11-12 DIAGNOSIS — N3001 Acute cystitis with hematuria: Secondary | ICD-10-CM

## 2022-11-12 LAB — HCG, QUANTITATIVE, PREGNANCY: hCG, Beta Chain, Quant, S: 1712 m[IU]/mL — ABNORMAL HIGH (ref <5)

## 2022-11-12 LAB — URINALYSIS, ROUTINE W REFLEX MICROSCOPIC
Bilirubin Urine: NEGATIVE
Glucose, UA: NEGATIVE mg/dL
Ketones, ur: NEGATIVE mg/dL
Leukocytes,Ua: NEGATIVE
Nitrite: POSITIVE — AB
Protein, ur: 30 mg/dL — AB
Specific Gravity, Urine: 1.015 (ref 1.005–1.030)
pH: 7 (ref 5.0–8.0)

## 2022-11-12 LAB — URINALYSIS, MICROSCOPIC (REFLEX)

## 2022-11-12 LAB — PREGNANCY, URINE: Preg Test, Ur: POSITIVE — AB

## 2022-11-12 MED ORDER — VITAMIN B-6 25 MG PO TABS: 25.0000 mg | ORAL_TABLET | Freq: Every day | ORAL | 0 refills | Status: AC

## 2022-11-12 MED ORDER — CEPHALEXIN 500 MG PO CAPS: 500.0000 mg | ORAL_CAPSULE | Freq: Two times a day (BID) | ORAL | 0 refills | Status: AC

## 2022-11-12 NOTE — ED Provider Notes (Signed)
[facility] Provider Note   CSN: [id number] Arrival date & time: 11/12/22  1217     History  Chief Complaint  Patient presents with   Vaginal Bleeding    Pregnant    Erin Powers is a 22 y.o. female with history of thyroid disease and anemia who presents the emergency department complaining of vaginal bleeding/spotting starting yesterday.  Patient states that her last menstrual period was on 12/25.  She had a positive pregnancy test at home, and another positive one at the health department few days ago.  She said that she started having a small amount of vaginal bleeding yesterday, lighter pink in color.  Today it started getting heavier, and looked like a menstrual cycle.  She reports 1 prior pregnancy, and 1 prior miscarriage.  No abdominal pain.  Has been feeling very nauseous and having intermittent hot flashes.   Vaginal Bleeding      Home Medications Prior to Admission medications   Medication Sig Start Date End Date Taking? Authorizing Provider  cephALEXin (KEFLEX) 500 MG capsule Take 1 capsule (500 mg total) by mouth 2 (two) times daily for 7 days. 11/12/22 11/19/22 Yes Pike Scantlebury T, PA-C  pyridOXINE (VITAMIN B6) 25 MG tablet Take 1 tablet (25 mg total) by mouth daily. 11/12/22  Yes Carmela Piechowski T, PA-C  meloxicam (MOBIC) 7.5 MG tablet Take 1 tablet (7.5 mg total) by mouth daily. 08/24/21   Carlisle Cater, PA-C  methocarbamol (ROBAXIN) 500 MG tablet Take 1 tablet (500 mg total) by mouth 4 (four) times daily. 08/24/21   Carlisle Cater, PA-C  Prenatal Vit-Fe Fumarate-FA (PRENATAL MULTIVITAMIN) TABS tablet Take 1 tablet by mouth daily at 12 noon.    [provider]      Allergies    Latex    Review of Systems   Review of Systems  Genitourinary:  Positive for vaginal bleeding.  All other systems reviewed and are negative.   Physical Exam Updated Vital Signs BP 111/70 (BP Location: Right Arm)   Pulse  82   Temp 99 F (37.2 C) (Oral)   Resp 13   Ht 5' 1"$  (1.549 m)   Wt 59.9 kg   LMP 09/24/2022 (Exact Date)   SpO2 97%   BMI 24.94 kg/m  Physical Exam Vitals and nursing note reviewed.  Constitutional:      Appearance: Normal appearance.  HENT:     Head: Normocephalic and atraumatic.  Eyes:     Conjunctiva/sclera: Conjunctivae normal.  Cardiovascular:     Rate and Rhythm: Normal rate and regular rhythm.  Pulmonary:     Effort: Pulmonary effort is normal. No respiratory distress.     Breath sounds: Normal breath sounds.  Abdominal:     General: There is no distension.     Palpations: Abdomen is soft.     Tenderness: There is no abdominal tenderness.  Skin:    General: Skin is warm and dry.  Neurological:     General: No focal deficit present.     Mental Status: She is alert.     ED Results / Procedures / Treatments   Labs (all labs ordered are listed, but only abnormal results are displayed) Labs Reviewed  PREGNANCY, URINE - Abnormal; Notable for the following components:      Result Value   Preg Test, Ur POSITIVE (*)    All other components within normal limits  URINALYSIS, ROUTINE W REFLEX MICROSCOPIC - Abnormal; Notable for the following components:  APPearance HAZY (*)    Hgb urine dipstick LARGE (*)    Protein, ur 30 (*)    Nitrite POSITIVE (*)    All other components within normal limits  HCG, QUANTITATIVE, PREGNANCY - Abnormal; Notable for the following components:   hCG, Beta Chain, Quant, S 1,712 (*)    All other components within normal limits  URINALYSIS, MICROSCOPIC (REFLEX) - Abnormal; Notable for the following components:   Bacteria, UA RARE (*)    All other components within normal limits    EKG None  Radiology US OB LESS THAN 14 WEEKS WITH OB TRANSVAGINAL  Result Date: 11/12/2022 CLINICAL DATA:  Vaginal bleeding. EXAM: OBSTETRIC <14 WK [place] AND TRANSVAGINAL OB US TECHNIQUE: Both transabdominal and transvaginal ultrasound examinations were  performed for complete evaluation of the gestation as well as the maternal uterus, adnexal regions, and pelvic cul-de-sac. Transvaginal technique was performed to assess early pregnancy. COMPARISON:  None Available. FINDINGS: Intrauterine gestational sac: None Yolk sac:  Not Visualized. Embryo:  Not Visualized. Cardiac Activity: Not Visualized. MSD: N/a Subchorionic hemorrhage:  None visualized. Maternal uterus/adnexae: The uterus is normal in appearance. The endometrium is not thickened. There is no abnormal vascularity. The right ovary is normal measuring 2.8 cm x 2.2 cm x 1.7 cm. The left ovary is normal measuring 3.0 cm x 3.5 cm x 1.4 cm. No adnexal mass is seen. There is no free fluid in the pelvis. IMPRESSION: No intrauterine pregnancy or finding suspicious for ectopic pregnancy. Findings are consistent with pregnancy of unknown location and may reflect early intrauterine pregnancy not yet visualized sonographically, occult ectopic pregnancy, or failed pregnancy. Recommend trending of beta HCG as well as follow-up ultrasound in 7-10 days based on clinical course. Electronically Signed   By: Valetta Mole M.D.   On: 11/12/2022 16:02    Procedures Procedures    Medications Ordered in ED Medications - No data to display  ED Course/ Medical Decision Making/ A&P                             Medical Decision Making Amount and/or Complexity of Data Reviewed Labs: ordered. Radiology: ordered.  Risk OTC drugs. Prescription drug management.  This patient is a 22 y.o. female  who presents to the ED for concern of vaginal bleeding x 1 day.   Differential diagnoses prior to evaluation: The emergent differential diagnosis includes, but is not limited to,  Abnormal uterine bleeding, threatened miscarriage, incomplete miscarriage, normal bleeding from an early trimester pregnancy, ectopic pregnancy, vaginal/cervical trauma, subchorionic hemorrhage/hematoma. This is not an exhaustive differential.    Past Medical History / Co-morbidities: thyroid disease and anemia  Additional history: Chart reviewed. Pertinent results include: Previously delivered at [facility] in April 2020  Physical Exam: Physical exam performed. The pertinent findings include: Patient with normal vital signs, no acute distress.  Abdomen soft and nontender.  GU exam deferred.  Lab Tests/Imaging studies: I personally interpreted labs/imaging and the pertinent results include: Urinalysis with large hemoglobin and positive nitrites, rare bacteria.  Urine pregnancy positive, hCG quantitative 1712.  No intrauterine pregnancy or ectopic pregnancy seen on ultrasound.  Findings consistent with pregnancy of unknown location, possible early intrauterine pregnancy, or failed pregnancy.  I agree with the radiologist interpretation.  Disposition: After consideration of the diagnostic results and the patients response to treatment, I feel that emergency department workup does not suggest an emergent condition requiring admission or immediate intervention beyond what has  been performed at this time. The plan is: Discharge home with OB/GYN follow-up.  Will prescribe antibiotics for UTI, vitamin B6 for nausea.  Provided with GYN follow-up resources, but recommended to present to [facility] if she cannot follow-up with anyone soon enough. She is Rh negative, but don't believe she requires Rhogam at this time. The patient is safe for discharge and has been instructed to return immediately for worsening symptoms, change in symptoms or any other concerns.  Final Clinical Impression(s) / ED Diagnoses Final diagnoses:  Positive pregnancy test  History of miscarriage  Vaginal bleeding in pregnancy  Acute cystitis with hematuria    Rx / DC Orders ED Discharge Orders          Ordered    cephALEXin (KEFLEX) 500 MG capsule  2 times daily        11/12/22 1659    pyridOXINE (VITAMIN B6) 25 MG tablet  Daily        11/12/22  1659           Portions of this report may have been transcribed using voice recognition software. Every effort was made to ensure accuracy; however, inadvertent computerized transcription errors may be present.    Estill Cotta 11/12/22 1808    Cristie Hem, MD 11/13/22 [phone]

## 2022-11-12 NOTE — Discharge Instructions (Addendum)
You were seen in the emergency department today for vaginal bleeding.  As we discussed your blood test shows that you are about 3 to [redacted] weeks pregnant.  However there was no pregnancy seen on ultrasound.  This could be just because it is too early, but also could be evidence of early miscarriage.  You will need repeat blood testing to confirm whether or not this hormone level is going up or down.    You also some evidence of urinary tract infection on prescribing you antibiotics.  Also prescribing you a nausea medicine that works as a vitamin in pregnancy.  I have attached a list of many OB/GYN's in the area for you to call and follow-up with.  If you are not able to get an appointment in the next 48 to 72 hours, I recommend either calling or going to the [facility] at [facility].  [place] for [employer] at [facility] [facility]             [address], [place], [place] [address] [place] for [facility] at [place], [place], [place], [place], [address] [phone]  Center for [facility] at [place], [place], [place], [place], [address] [phone]  Center for [facility] at [facility] [address], [place], [place], [place], [address] [phone]  Center for [facility] at [facility]                                 [place], [place], [place], [address] [phone]  Center for [facility] at [facility]                                    [address] [place], [place], [address] [phone]  Center for [place] at [facility] [address] [place], [place], [place], [address]                              [facility] [address] [place], [place], [place], [place], [address] [phone]  [facility]         Phone: [phone]  [facility]      Phone: [phone]   [facility]      Phone: [place] Department-Family Planning         Phone: [phone]   [place] Department-Maternity    Phone: [place]      Phone: [phone]  [facility]     Phone: [phone]  [facility]        Phone: [phone]  [facility]      Phone: [phone]

## 2022-11-12 NOTE — ED Triage Notes (Signed)
Positive pregnancy test last week, LMP 12/25 was last menstrual, started having vaginal bleeding (spotting) yesterday. G2P1

## 2022-11-14 ENCOUNTER — Ambulatory Visit (INDEPENDENT_AMBULATORY_CARE_PROVIDER_SITE_OTHER): Payer: Medicaid Other | Admitting: *Deleted

## 2022-11-14 VITALS — BP 115/66 | HR 84 | Ht 62.0 in | Wt 126.9 lb

## 2022-11-14 DIAGNOSIS — Z3A00 Weeks of gestation of pregnancy not specified: Secondary | ICD-10-CM

## 2022-11-14 DIAGNOSIS — O3680X0 Pregnancy with inconclusive fetal viability, not applicable or unspecified: Secondary | ICD-10-CM

## 2022-11-14 LAB — BETA HCG QUANT (REF LAB): hCG Quant: 1475 m[IU]/mL

## 2022-11-14 NOTE — Progress Notes (Signed)
Here for stat bhcg. She c/o bleeding like a period and mild cramping =3-4.  I explained we will draw the stat bhcg and call her with results and plan of care in approximately 2-4 hours , although it may be later. She voices understanding. Staci Acosta 5:15pm Stat bhcg results not back, Per protocol I called the 2nd attending ( Dr Elgie Congo) at [facility] and reported assessment and he will follow up with results and contacting patient. Staci Acosta

## 2022-11-15 ENCOUNTER — Telehealth: Payer: Self-pay | Admitting: *Deleted

## 2022-11-15 ENCOUNTER — Other Ambulatory Visit: Payer: Self-pay | Admitting: Lactation Services

## 2022-11-15 DIAGNOSIS — O3680X0 Pregnancy with inconclusive fetal viability, not applicable or unspecified: Secondary | ICD-10-CM

## 2022-11-15 NOTE — Telephone Encounter (Signed)
Per follow up of stat bhcg. Do not see that patient was contacted. Results reviewed with Dr Dione Plover and he advised appears to be a miscarriage, needs weekly nonstat bhcg until negative and offer fu with provider.  I called Erin Powers and informed her of results and plan of care. I offer condolences. She agreed to non stat bhcg 11/21/22 3:30pm. She would like fu with provider. I explained I will message front office to schedule. She voices understanding. Staci Acosta

## 2022-11-21 ENCOUNTER — Other Ambulatory Visit: Payer: Medicaid Other

## 2022-11-22 ENCOUNTER — Other Ambulatory Visit: Payer: Medicaid Other

## 2022-11-22 DIAGNOSIS — O3680X0 Pregnancy with inconclusive fetal viability, not applicable or unspecified: Secondary | ICD-10-CM

## 2022-11-23 LAB — BETA HCG QUANT (REF LAB): hCG Quant: 1481 m[IU]/mL

## 2022-12-13 ENCOUNTER — Other Ambulatory Visit: Payer: Self-pay

## 2022-12-13 ENCOUNTER — Ambulatory Visit: Payer: Medicaid Other | Admitting: Obstetrics and Gynecology

## 2022-12-13 DIAGNOSIS — O039 Complete or unspecified spontaneous abortion without complication: Secondary | ICD-10-CM
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
